# Patient Record
Sex: Female | Born: 1935 | Race: White | Hispanic: No | Smoking: Never smoker
Health system: Southern US, Community
[De-identification: ages and names within clinical notes are randomized; demographics above are authoritative.]

## PROBLEM LIST (undated history)

## (undated) DIAGNOSIS — H9190 Unspecified hearing loss, unspecified ear: Secondary | ICD-10-CM

## (undated) DIAGNOSIS — F0390 Unspecified dementia without behavioral disturbance: Secondary | ICD-10-CM

## (undated) DIAGNOSIS — M1990 Unspecified osteoarthritis, unspecified site: Secondary | ICD-10-CM

## (undated) DIAGNOSIS — I10 Essential (primary) hypertension: Secondary | ICD-10-CM

## (undated) DIAGNOSIS — F32A Depression, unspecified: Secondary | ICD-10-CM

## (undated) DIAGNOSIS — F329 Major depressive disorder, single episode, unspecified: Secondary | ICD-10-CM

## (undated) DIAGNOSIS — H3530 Unspecified macular degeneration: Secondary | ICD-10-CM

---

## 2014-09-21 ENCOUNTER — Ambulatory Visit (INDEPENDENT_AMBULATORY_CARE_PROVIDER_SITE_OTHER): Payer: Medicare Other | Admitting: Podiatry

## 2014-09-21 ENCOUNTER — Encounter: Payer: Self-pay | Admitting: Podiatry

## 2014-09-21 VITALS — BP 155/73 | HR 67

## 2014-09-21 DIAGNOSIS — L600 Ingrowing nail: Secondary | ICD-10-CM | POA: Diagnosis not present

## 2014-09-21 DIAGNOSIS — M79673 Pain in unspecified foot: Secondary | ICD-10-CM | POA: Diagnosis not present

## 2014-09-21 DIAGNOSIS — B351 Tinea unguium: Secondary | ICD-10-CM | POA: Diagnosis not present

## 2014-09-21 DIAGNOSIS — M79606 Pain in leg, unspecified: Secondary | ICD-10-CM | POA: Insufficient documentation

## 2014-09-21 DIAGNOSIS — M79605 Pain in left leg: Secondary | ICD-10-CM

## 2014-09-21 NOTE — Patient Instructions (Signed)
Seen for hypertrophic nails. All nails debrided. Return in 3 months or as needed.  

## 2014-09-21 NOTE — Progress Notes (Signed)
Subjective: 81 year old female presents accompanied by her daughter requesting toe nails trimmed. Patient is on wheel chair.  Review of Systems - Patient is hard of hearing and difficulty  With communication.   Objective: All pedal pulses are palpable. Both feet are cold. Thick nails with ingrown left great toe nail. Neurovascular status are within normal.  Mild digital contracture bilateral.   Assessment: Ingrown nail left great toe. Mycotic nails both great toes.  Plan:  Reviewed findings. All nails debrided.  Return as needed.

## 2014-12-25 ENCOUNTER — Encounter: Payer: Self-pay | Admitting: Podiatry

## 2014-12-25 ENCOUNTER — Ambulatory Visit (INDEPENDENT_AMBULATORY_CARE_PROVIDER_SITE_OTHER): Payer: Medicare Other | Admitting: Podiatry

## 2014-12-25 VITALS — BP 144/68 | HR 69

## 2014-12-25 DIAGNOSIS — M79606 Pain in leg, unspecified: Secondary | ICD-10-CM

## 2014-12-25 DIAGNOSIS — L03032 Cellulitis of left toe: Secondary | ICD-10-CM | POA: Diagnosis not present

## 2014-12-25 DIAGNOSIS — L600 Ingrowing nail: Secondary | ICD-10-CM | POA: Diagnosis not present

## 2014-12-25 DIAGNOSIS — B351 Tinea unguium: Secondary | ICD-10-CM

## 2014-12-25 NOTE — Patient Instructions (Signed)
Seen for hypertrophic nails. Noted of infected nail base left great toe.  All nails debrided. Pus drained and cleansed with Iodine.  Do Iodine cleansing on left great toe daily till healed. Return in 3 months or as needed.

## 2014-12-25 NOTE — Progress Notes (Signed)
Subjective: 81 year old female presents accompanied by her daughter requesting toe nails trimmed. Patient is on wheel chair.  Review of Systems - Patient is hard of hearing and difficulty With communication.   Objective: All pedal pulses are palpable. Both feet are cold. Thick nails with ingrown left great toe nail. Positive of pus pocket at the proximal medial border left great toe without associated drainage or cellulitis. Neurovascular status are within normal.  Mild digital contracture bilateral.   Assessment: Ingrown nail left great toe. Mycotic nails both great toes. Paronychia left great toe.  Plan:  Reviewed findings. All nails debrided.  I&D Paronychia left great toe.  Home care instruction given.  Return as needed.

## 2015-01-22 ENCOUNTER — Encounter: Payer: Self-pay | Admitting: Podiatry

## 2015-01-22 ENCOUNTER — Ambulatory Visit (INDEPENDENT_AMBULATORY_CARE_PROVIDER_SITE_OTHER): Payer: Medicare Other | Admitting: Podiatry

## 2015-01-22 VITALS — BP 146/58 | HR 71

## 2015-01-22 DIAGNOSIS — B351 Tinea unguium: Secondary | ICD-10-CM | POA: Diagnosis not present

## 2015-01-22 DIAGNOSIS — L600 Ingrowing nail: Secondary | ICD-10-CM | POA: Diagnosis not present

## 2015-01-22 NOTE — Progress Notes (Signed)
Subjective: 81 year old female presents via wheel chair accompanied by her daughter complaining of red toe with possible ingrown nail on left great toe.   Objective: All pedal pulses are palpable. Both feet are cold. Thick dystrophic yellow nail with inflamed ungual labia left great toe. No open skin or active drainage noted.  Neurovascular status are within normal.  Mild digital contracture bilateral.   Assessment: Deformed mycotic nail with inflamed ungual labia left great toe without cellulitis or active infection.  Plan:  Reviewed findings. Affected nail debrided. Lotrimin cream applied. Return as needed.

## 2015-01-22 NOTE — Patient Instructions (Signed)
Ingrown mycotic nail removed. No active infection noted other than fungal nail.  Return as needed or follow routine foot care schedule.

## 2015-03-27 ENCOUNTER — Encounter: Payer: Self-pay | Admitting: Podiatry

## 2015-03-27 ENCOUNTER — Ambulatory Visit (INDEPENDENT_AMBULATORY_CARE_PROVIDER_SITE_OTHER): Payer: Medicare Other | Admitting: Podiatry

## 2015-03-27 VITALS — BP 150/59 | HR 69

## 2015-03-27 DIAGNOSIS — B351 Tinea unguium: Secondary | ICD-10-CM

## 2015-03-27 DIAGNOSIS — L600 Ingrowing nail: Secondary | ICD-10-CM | POA: Diagnosis not present

## 2015-03-27 DIAGNOSIS — M79606 Pain in leg, unspecified: Secondary | ICD-10-CM

## 2015-03-27 NOTE — Patient Instructions (Signed)
Seen for hypertrophic nails. All nails debrided. Return in 3 months or as needed.  

## 2015-03-27 NOTE — Progress Notes (Signed)
Subjective: 81 year old female presents via wheel chair accompanied by her daughter complaining of painful nails.   Objective: All pedal pulses are not palpable today. Both feet are cold. Thick dystrophic yellow nail x 10.  Ingrown nail on both great toe medial borders without open skin or active drainage.  Neurovascular status are within normal.  Mild digital contracture bilateral.   Assessment: Deformed mycotic nails x 10. Ingrown nails both great toes without cellulitis or active infection.  Plan:  Reviewed findings. Affected nail debrided. Lotrimin cream applied to both great toes. Return as needed.

## 2015-06-25 ENCOUNTER — Ambulatory Visit: Payer: Medicare Other | Admitting: Podiatry

## 2015-07-02 ENCOUNTER — Encounter: Payer: Self-pay | Admitting: Podiatry

## 2015-07-02 ENCOUNTER — Ambulatory Visit (INDEPENDENT_AMBULATORY_CARE_PROVIDER_SITE_OTHER): Payer: Medicare Other | Admitting: Podiatry

## 2015-07-02 VITALS — BP 142/62 | HR 65

## 2015-07-02 DIAGNOSIS — B351 Tinea unguium: Secondary | ICD-10-CM

## 2015-07-02 DIAGNOSIS — M79605 Pain in left leg: Secondary | ICD-10-CM

## 2015-07-02 NOTE — Patient Instructions (Signed)
Seen for hypertrophic nails. All nails debrided. Return in 3 months or as needed.  

## 2015-07-02 NOTE — Progress Notes (Signed)
Subjective: 82 year old female presents via wheel chair accompanied by her daughter complaining of painful nails.   Objective: All pedal pulses are not palpable today. Both feet are cold. Thick dystrophic yellow nail x 10.  Ingrown nail on both great toe medial borders without open skin or active drainage.  Neurovascular status are within normal.  Mild digital contracture bilateral.   Assessment: Deformed mycotic nails x 10. Painful nails with ambulation.   Plan:  Reviewed findings. Affected nail debrided. Return as needed.

## 2015-09-22 ENCOUNTER — Emergency Department (HOSPITAL_BASED_OUTPATIENT_CLINIC_OR_DEPARTMENT_OTHER): Payer: Medicare Other

## 2015-09-22 ENCOUNTER — Emergency Department (HOSPITAL_BASED_OUTPATIENT_CLINIC_OR_DEPARTMENT_OTHER)
Admission: EM | Admit: 2015-09-22 | Discharge: 2015-09-22 | Disposition: A | Discharge status: Home / Self Care | Payer: Medicare Other | Attending: Emergency Medicine | Admitting: Emergency Medicine

## 2015-09-22 ENCOUNTER — Encounter (HOSPITAL_BASED_OUTPATIENT_CLINIC_OR_DEPARTMENT_OTHER): Payer: Self-pay | Admitting: *Deleted

## 2015-09-22 DIAGNOSIS — Y999 Unspecified external cause status: Secondary | ICD-10-CM | POA: Diagnosis not present

## 2015-09-22 DIAGNOSIS — Y939 Activity, unspecified: Secondary | ICD-10-CM | POA: Insufficient documentation

## 2015-09-22 DIAGNOSIS — I10 Essential (primary) hypertension: Secondary | ICD-10-CM | POA: Diagnosis not present

## 2015-09-22 DIAGNOSIS — M1990 Unspecified osteoarthritis, unspecified site: Secondary | ICD-10-CM | POA: Diagnosis not present

## 2015-09-22 DIAGNOSIS — F329 Major depressive disorder, single episode, unspecified: Secondary | ICD-10-CM | POA: Diagnosis not present

## 2015-09-22 DIAGNOSIS — Y929 Unspecified place or not applicable: Secondary | ICD-10-CM | POA: Insufficient documentation

## 2015-09-22 DIAGNOSIS — F0390 Unspecified dementia without behavioral disturbance: Secondary | ICD-10-CM | POA: Diagnosis not present

## 2015-09-22 DIAGNOSIS — S8992XA Unspecified injury of left lower leg, initial encounter: Secondary | ICD-10-CM | POA: Diagnosis present

## 2015-09-22 DIAGNOSIS — S8012XA Contusion of left lower leg, initial encounter: Secondary | ICD-10-CM | POA: Insufficient documentation

## 2015-09-22 DIAGNOSIS — W19XXXA Unspecified fall, initial encounter: Secondary | ICD-10-CM | POA: Diagnosis not present

## 2015-09-22 DIAGNOSIS — Z79899 Other long term (current) drug therapy: Secondary | ICD-10-CM | POA: Diagnosis not present

## 2015-09-22 HISTORY — DX: Depression, unspecified: F32.A

## 2015-09-22 HISTORY — DX: Unspecified macular degeneration: H35.30

## 2015-09-22 HISTORY — DX: Major depressive disorder, single episode, unspecified: F32.9

## 2015-09-22 HISTORY — DX: Unspecified osteoarthritis, unspecified site: M19.90

## 2015-09-22 HISTORY — DX: Essential (primary) hypertension: I10

## 2015-09-22 HISTORY — DX: Unspecified dementia, unspecified severity, without behavioral disturbance, psychotic disturbance, mood disturbance, and anxiety: F03.90

## 2015-09-22 LAB — URINALYSIS, ROUTINE W REFLEX MICROSCOPIC
Bilirubin Urine: NEGATIVE
GLUCOSE, UA: NEGATIVE mg/dL
Hgb urine dipstick: NEGATIVE
Ketones, ur: NEGATIVE mg/dL
Nitrite: NEGATIVE
PH: 6.5 (ref 5.0–8.0)
Protein, ur: NEGATIVE mg/dL
SPECIFIC GRAVITY, URINE: 1.013 (ref 1.005–1.030)

## 2015-09-22 LAB — URINE MICROSCOPIC-ADD ON

## 2015-09-22 NOTE — ED Notes (Signed)
Assisted pt with bedpan. incont care provided and brief placed. Assisted with getting patient dressed and in wheelchair and to the car. D/c instructions reviewed with pt's daughter who has poa and she  Voiced understanding.

## 2015-09-22 NOTE — Discharge Instructions (Signed)
Patient's x-rays are normal.  Her urine was sent for a culture to assess for infection.  If the culture comes back positive, you should get a call to start antibiotics.    Contusion A contusion is a deep bruise. Contusions are the result of a blunt injury to tissues and muscle fibers under the skin. The injury causes bleeding under the skin. The skin overlying the contusion may turn blue, purple, or yellow. Minor injuries will give you a painless contusion, but more severe contusions may stay painful and swollen for a few weeks.  CAUSES  This condition is usually caused by a blow, trauma, or direct force to an area of the body. SYMPTOMS  Symptoms of this condition include:  Swelling of the injured area.  Pain and tenderness in the injured area.  Discoloration. The area may have redness and then turn blue, purple, or yellow. DIAGNOSIS  This condition is diagnosed based on a physical exam and medical history. An X-ray, CT scan, or MRI may be needed to determine if there are any associated injuries, such as broken bones (fractures). TREATMENT  Specific treatment for this condition depends on what area of the body was injured. In general, the best treatment for a contusion is resting, icing, applying pressure to (compression), and elevating the injured area. This is often called the RICE strategy. Over-the-counter anti-inflammatory medicines may also be recommended for pain control.  HOME CARE INSTRUCTIONS   Rest the injured area.  If directed, apply ice to the injured area:  Put ice in a plastic bag.  Place a towel between your skin and the bag.  Leave the ice on for 20 minutes, 2-3 times per day.  If directed, apply light compression to the injured area using an elastic bandage. Make sure the bandage is not wrapped too tightly. Remove and reapply the bandage as directed by your health care provider.  If possible, raise (elevate) the injured area above the level of your heart while you  are sitting or lying down.  Take over-the-counter and prescription medicines only as told by your health care provider. SEEK MEDICAL CARE IF:  Your symptoms do not improve after several days of treatment.  Your symptoms get worse.  You have difficulty moving the injured area. SEEK IMMEDIATE MEDICAL CARE IF:   You have severe pain.  You have numbness in a hand or foot.  Your hand or foot turns pale or cold.   This information is not intended to replace advice given to you by your health care provider. Make sure you discuss any questions you have with your health care provider.   Document Released: 01/21/2005 Document Revised: 01/02/2015 Document Reviewed: 08/29/2014 Elsevier Interactive Patient Education [employer]2016 [employer] Inc.

## 2015-09-22 NOTE — ED Notes (Signed)
Per EMS:  [facility].  Reports unsure if it was witnessed or not.  States HA and L knee pain since that.  HTN-hx of same.  220/98

## 2015-09-22 NOTE — ED Provider Notes (Signed)
CSN: [id number]     Arrival date & time 09/22/15  2025 History  By signing my name below, I, Soijett Blue, attest that this documentation has been prepared under the direction and in the presence of Rolan Bucco, MD. Electronically Signed: Soijett Blue, ED Scribe. 09/22/2015. 9:21 PM.   Chief Complaint  Patient presents with  . Fall    LEVEL 5 CAVEAT: DEMENTIA  The history is provided by the EMS personnel and a relative. No language interpreter was used.    Madison Murray is a 82 y.o. female with a medical hx of dementia who presents to the Emergency Department via EMS complaining of a fall onset PTA. Hx provided by pt daughter who reports that the pt had an unwitnessed fall and was found on the floor via the staff. She was seated on the floor when they found her. Pt daughter was informed by the staff that the pt was trying to find a bathroom and that is what is believed to be the cause of her fall. Pt is typically wheelchair bound. Pt daughter states that the pt appears at her baseline. Pt daughter denies the pt having any recent illnesses.   Per EMS pt is having associated symptoms of HA and left knee pain. Pt daughter states that the left knee pain is chronic. She notes that she has not tried any medications for the relief of her symptoms. She denies any other symptoms.   Past Medical History  Diagnosis Date  . Hypertension   . Dementia   . Osteoarthritis   . Depression   . Macular degeneration    History reviewed. No pertinent past surgical history. History reviewed. No pertinent family history. Social History  Substance Use Topics  . Smoking status: Never Smoker   . Smokeless tobacco: Never Used  . Alcohol Use: No   OB History    No data available     Review of Systems  Unable to perform ROS: Dementia    Allergies  Demerol; Naproxen; Percocet; and Phenergan  Home Medications   Prior to Admission medications   Medication Sig Start Date End Date Taking?  Authorizing Provider  aspirin 81 MG tablet Take 81 mg by mouth daily.    Historical Provider, MD  metoprolol tartrate (LOPRESSOR) 25 MG tablet  09/08/14   Historical Provider, MD  ondansetron (ZOFRAN-ODT) 4 MG disintegrating tablet  06/29/14   Historical Provider, MD   BP 199/89 mmHg  Pulse 71  Resp 20  SpO2 98% Physical Exam  Constitutional: She is oriented to person, place, and time. She appears well-developed and well-nourished.  HENT:  Head: Normocephalic and atraumatic.  Eyes: Pupils are equal, round, and reactive to light.  Neck: Normal range of motion. Neck supple.  Cardiovascular: Normal rate and regular rhythm.  Exam reveals no gallop and no friction rub.   Murmur heard. Pulmonary/Chest: Effort normal and breath sounds normal. No respiratory distress. She has no wheezes. She has no rales. She exhibits no tenderness.  Abdominal: Soft. Bowel sounds are normal. There is no tenderness. There is no rebound and no guarding.  Musculoskeletal: Normal range of motion. She exhibits no edema.  No pain along hte spine. Pain on ROM of the left hip and left knee. No swelling or deformity. No pain to the ankle. No other pain on palpation of other extremities. Moves all extremities symmetrical.   Lymphadenopathy:    She has no cervical adenopathy.  Neurological: She is alert and oriented to person, place, and time.  Sleepy but wakes up to answer questions.   Skin: Skin is warm and dry. No rash noted.  Psychiatric: She has a normal mood and affect.  Nursing note and vitals reviewed.   ED Course  Procedures (including critical care time) DIAGNOSTIC STUDIES: Oxygen Saturation is 98% on RA, nl by my interpretation.    COORDINATION OF CARE: 9:14 PM Discussed treatment plan with pt at bedside which includes UA, CT head, CT c-spine, left hip unilateral with pelvis xray, left knee xray and pt agreed to plan.    Labs Review Labs Reviewed  URINALYSIS, ROUTINE W REFLEX MICROSCOPIC (NOT AT [facility])  - Abnormal; Notable for the following:    Leukocytes, UA MODERATE (*)    All other components within normal limits  URINE MICROSCOPIC-ADD ON - Abnormal; Notable for the following:    Squamous Epithelial / LPF 0-5 (*)    Bacteria, UA FEW (*)    All other components within normal limits  URINE CULTURE    Imaging Review Ct Head Wo Contrast  09/22/2015  CLINICAL DATA:  Unwitnessed fall prior to arrival.  Headache. EXAM: CT HEAD WITHOUT CONTRAST CT CERVICAL SPINE WITHOUT CONTRAST TECHNIQUE: Multidetector CT imaging of the head and cervical spine was performed following the standard protocol without intravenous contrast. Multiplanar CT image reconstructions of the cervical spine were also generated. COMPARISON:  None. FINDINGS: CT HEAD FINDINGS No intracranial hemorrhage, mass effect, or midline shift. Generalized cerebral atrophy. Advanced chronic small vessel ischemia. No hydrocephalus. The basilar cisterns are patent. No evidence of territorial infarct. No intracranial fluid collection. Atherosclerosis of skullbase vasculature. Calvarium is intact. Mucosal thickening of the left maxillary sinus. The mastoid air cells are well aerated. CT CERVICAL SPINE FINDINGS No fracture or acute subluxation. The dens is intact. There are no jumped or perched facets. Multilevel degenerative change throughout cervical spine. Disc space narrowing throughout with endplate spurs, most prominent at C5-C6 and C6-C7. Anterolisthesis of sleep 4 on C5 appears degenerative. Multilevel facet arthropathy. No prevertebral soft tissue edema. There is biapical pleural parenchymal scarring. IMPRESSION: 1. No acute intracranial abnormality. Atrophy and advanced chronic small vessel ischemia. 2. Advanced multilevel degenerative change in the cervical spine without acute fracture or subluxation. Electronically Signed   By: Rubye Oaks M.D.   On: 09/22/2015 22:17   Ct Cervical Spine Wo Contrast  09/22/2015  CLINICAL DATA:   Unwitnessed fall prior to arrival.  Headache. EXAM: CT HEAD WITHOUT CONTRAST CT CERVICAL SPINE WITHOUT CONTRAST TECHNIQUE: Multidetector CT imaging of the head and cervical spine was performed following the standard protocol without intravenous contrast. Multiplanar CT image reconstructions of the cervical spine were also generated. COMPARISON:  None. FINDINGS: CT HEAD FINDINGS No intracranial hemorrhage, mass effect, or midline shift. Generalized cerebral atrophy. Advanced chronic small vessel ischemia. No hydrocephalus. The basilar cisterns are patent. No evidence of territorial infarct. No intracranial fluid collection. Atherosclerosis of skullbase vasculature. Calvarium is intact. Mucosal thickening of the left maxillary sinus. The mastoid air cells are well aerated. CT CERVICAL SPINE FINDINGS No fracture or acute subluxation. The dens is intact. There are no jumped or perched facets. Multilevel degenerative change throughout cervical spine. Disc space narrowing throughout with endplate spurs, most prominent at C5-C6 and C6-C7. Anterolisthesis of sleep 4 on C5 appears degenerative. Multilevel facet arthropathy. No prevertebral soft tissue edema. There is biapical pleural parenchymal scarring. IMPRESSION: 1. No acute intracranial abnormality. Atrophy and advanced chronic small vessel ischemia. 2. Advanced multilevel degenerative change in the cervical spine without acute fracture or  subluxation. Electronically Signed   By: Rubye OaksMelanie  Ehinger M.D.   On: 09/22/2015 22:17   Dg Knee Complete 4 Views Left  09/22/2015  CLINICAL DATA:  Left knee pain after fall today. EXAM: LEFT KNEE - COMPLETE 4+ VIEW COMPARISON:  None. FINDINGS: No acute fracture or dislocation. Moderate osteoarthritis with tricompartmental osteophytes and narrowing of the medial compartment. The bones are under mineralized. There is a prominent quadriceps tendon enthesophyte. Small joint effusion. IMPRESSION: Tricompartmental osteoarthritis without  evidence of acute fracture or subluxation. Electronically Signed   By: Rubye OaksMelanie  Ehinger M.D.   On: 09/22/2015 21:54   Dg Hip Unilat With Pelvis 2-3 Views Left  09/22/2015  CLINICAL DATA:  Left hip pain after fall tonight. EXAM: DG HIP (WITH OR WITHOUT PELVIS) 2-3V LEFT COMPARISON:  None. FINDINGS: Lateral plate and compression screw fixation fixate proximal femur fracture. Hardware is intact. No periprosthetic fracture. No acute fractures seen. Bony pelvis including the pubic rami appear intact. The bones are under mineralized. IMPRESSION: Intact surgical hardware about the left hip, no complication. No acute or periprosthetic fracture. Electronically Signed   By: Rubye OaksMelanie  Ehinger M.D.   On: 09/22/2015 21:56   I have personally reviewed and evaluated these images and lab results as part of my medical decision-making.   EKG Interpretation None      MDM   Final diagnoses:  Fall, initial encounter  Contusion of leg, left, initial encounter    Patient status post unwitnessed fall. She has a dementia but per family is at baseline. There is no reported recent illnesses or change in behavior. Patient currently is alert and talking. She is at her baseline mental status per her daughter. There is no evidence of fractures. She is able to raise her left leg off the bed without discomfort. I did not ambulate her as she is wheelchair-bound per the daughter. She was discharged back to the nursing home in good condition. Her urine had positive leukocyte esterase but no other suggestions of infection. Her urine was sent for culture but I did not start her on antibiotics at this point.  I personally performed the services described in this documentation, which was scribed in my presence.  The recorded information has been reviewed and considered.    Rolan BuccoMelanie Tan Clopper, MD 09/22/15 [phone]

## 2015-09-24 LAB — URINE CULTURE: SPECIAL REQUESTS: NORMAL

## 2015-10-02 ENCOUNTER — Ambulatory Visit (INDEPENDENT_AMBULATORY_CARE_PROVIDER_SITE_OTHER): Payer: Medicare Other | Admitting: Podiatry

## 2015-10-02 ENCOUNTER — Encounter: Payer: Self-pay | Admitting: Podiatry

## 2015-10-02 DIAGNOSIS — M79673 Pain in unspecified foot: Secondary | ICD-10-CM

## 2015-10-02 DIAGNOSIS — B351 Tinea unguium: Secondary | ICD-10-CM | POA: Diagnosis not present

## 2015-10-02 DIAGNOSIS — L600 Ingrowing nail: Secondary | ICD-10-CM | POA: Diagnosis not present

## 2015-10-02 DIAGNOSIS — M79606 Pain in leg, unspecified: Secondary | ICD-10-CM

## 2015-10-02 NOTE — Patient Instructions (Signed)
Seen for hypertrophic nails. All nails debrided. Return in 3 months or as needed.  

## 2015-10-02 NOTE — Progress Notes (Signed)
Subjective: 82 year old female presents via wheel chair accompanied by her daughter complaining of painful nails.   Objective: All pedal pulses are not palpable today. Both feet are cold. Thick dystrophic yellow nail x 10.  Ingrown nail on both great toe medial borders without open skin or active drainage.  Neurovascular status are within normal.  Mild digital contracture bilateral.   Assessment: Deformed mycotic nails x 10. Ingrown nails on both great toe both borders with pain.  Painful nails with ambulation.   Plan:  Reviewed findings. Affected nails debrided. Return as needed

## 2016-01-02 ENCOUNTER — Ambulatory Visit: Payer: Medicare Other | Admitting: Podiatry

## 2016-01-09 ENCOUNTER — Ambulatory Visit: Payer: Medicare Other | Admitting: Podiatry

## 2016-01-14 ENCOUNTER — Ambulatory Visit (INDEPENDENT_AMBULATORY_CARE_PROVIDER_SITE_OTHER): Payer: Medicare Other | Admitting: Podiatry

## 2016-01-14 ENCOUNTER — Encounter: Payer: Self-pay | Admitting: Podiatry

## 2016-01-14 DIAGNOSIS — B351 Tinea unguium: Secondary | ICD-10-CM | POA: Diagnosis not present

## 2016-01-14 DIAGNOSIS — L600 Ingrowing nail: Secondary | ICD-10-CM

## 2016-01-14 DIAGNOSIS — M79606 Pain in leg, unspecified: Secondary | ICD-10-CM

## 2016-01-14 NOTE — Patient Instructions (Signed)
Seen for hypertrophic nails. Noted of ingrown nail on both great toe without infection or inflammation.  All nails debrided. Return in 3 months or sooner if needed.

## 2016-01-14 NOTE — Progress Notes (Signed)
Subjective: 82 year old female presents via wheel chair accompanied by her daughter complaining of painful nails.   Objective: All pedal pulses are not palpable today. Both feet are cold. Thick dystrophic yellow nail x 10.  Ingrown nail on both great toe medial borders without open skin or active drainage.  Neurovascular status are within normal.  Positive for mild lower limb edema above ankle joint. Mild digital contracture bilateral.   Assessment: Deformed mycotic nails x 10. Ingrown nails on both great toe both borders with pain. No open skin or inflammation noted. Painful nails.  Plan:  Reviewed findings. Affected nails debrided. Advised to return as soon as the toes become symptomatic.

## 2017-02-13 ENCOUNTER — Encounter (HOSPITAL_BASED_OUTPATIENT_CLINIC_OR_DEPARTMENT_OTHER): Payer: Self-pay | Admitting: Emergency Medicine

## 2017-02-13 ENCOUNTER — Emergency Department (HOSPITAL_BASED_OUTPATIENT_CLINIC_OR_DEPARTMENT_OTHER): Payer: Medicare Other

## 2017-02-13 ENCOUNTER — Emergency Department (HOSPITAL_BASED_OUTPATIENT_CLINIC_OR_DEPARTMENT_OTHER)
Admission: EM | Admit: 2017-02-13 | Discharge: 2017-02-13 | Disposition: A | Discharge status: Home / Self Care | Payer: Medicare Other | Attending: Emergency Medicine | Admitting: Emergency Medicine

## 2017-02-13 DIAGNOSIS — I10 Essential (primary) hypertension: Secondary | ICD-10-CM | POA: Diagnosis not present

## 2017-02-13 DIAGNOSIS — F0391 Unspecified dementia with behavioral disturbance: Secondary | ICD-10-CM | POA: Insufficient documentation

## 2017-02-13 DIAGNOSIS — Z7982 Long term (current) use of aspirin: Secondary | ICD-10-CM | POA: Diagnosis not present

## 2017-02-13 DIAGNOSIS — Z79899 Other long term (current) drug therapy: Secondary | ICD-10-CM | POA: Diagnosis not present

## 2017-02-13 DIAGNOSIS — R4182 Altered mental status, unspecified: Secondary | ICD-10-CM | POA: Diagnosis present

## 2017-02-13 LAB — URINALYSIS, COMPLETE (UACMP) WITH MICROSCOPIC
Bilirubin Urine: NEGATIVE
Glucose, UA: NEGATIVE mg/dL
Hgb urine dipstick: NEGATIVE
KETONES UR: NEGATIVE mg/dL
Leukocytes, UA: NEGATIVE
Nitrite: NEGATIVE
PH: 7 (ref 5.0–8.0)
PROTEIN: 30 mg/dL — AB
Specific Gravity, Urine: 1.015 (ref 1.005–1.030)

## 2017-02-13 LAB — COMPREHENSIVE METABOLIC PANEL
ALBUMIN: 4.1 g/dL (ref 3.5–5.0)
ALK PHOS: 62 U/L (ref 38–126)
ALT: 13 U/L — ABNORMAL LOW (ref 14–54)
AST: 18 U/L (ref 15–41)
Anion gap: 7 (ref 5–15)
BILIRUBIN TOTAL: 0.8 mg/dL (ref 0.3–1.2)
BUN: 15 mg/dL (ref 6–20)
CHLORIDE: 104 mmol/L (ref 101–111)
CO2: 28 mmol/L (ref 22–32)
CREATININE: 0.66 mg/dL (ref 0.44–1.00)
Calcium: 9 mg/dL (ref 8.9–10.3)
GFR calc Af Amer: >60 mL/min (ref >60)
GFR calc non Af Amer: >60 mL/min (ref >60)
GLUCOSE: 105 mg/dL — AB (ref 65–99)
Potassium: 3.9 mmol/L (ref 3.5–5.1)
SODIUM: 139 mmol/L (ref 135–145)
Total Protein: 6.9 g/dL (ref 6.5–8.1)

## 2017-02-13 LAB — CBC WITH DIFFERENTIAL/PLATELET
Basophils Absolute: 0.2 10*3/uL — ABNORMAL HIGH (ref 0.0–0.1)
Basophils Relative: 2 %
EOS PCT: 2 %
Eosinophils Absolute: 0.1 10*3/uL (ref 0.0–0.7)
HCT: 41.2 % (ref 36.0–46.0)
Hemoglobin: 13.6 g/dL (ref 12.0–15.0)
LYMPHS ABS: 1.6 10*3/uL (ref 0.7–4.0)
Lymphocytes Relative: 21 %
MCH: 31.9 pg (ref 26.0–34.0)
MCHC: 33 g/dL (ref 30.0–36.0)
MCV: 96.7 fL (ref 78.0–100.0)
MONO ABS: 0.6 10*3/uL (ref 0.1–1.0)
MONOS PCT: 8 %
NEUTROS PCT: 67 %
Neutro Abs: 5 10*3/uL (ref 1.7–7.7)
Platelets: 187 10*3/uL (ref 150–400)
RBC: 4.26 MIL/uL (ref 3.87–5.11)
RDW: 13.9 % (ref 11.5–15.5)
WBC: 7.5 10*3/uL (ref 4.0–10.5)

## 2017-02-13 LAB — CBG MONITORING, ED: Glucose-Capillary: 86 mg/dL (ref 65–99)

## 2017-02-13 NOTE — ED Triage Notes (Signed)
Patient is from nursing home facility.  Son requested patient to transfer for evaluation? Possible UTI.    Patient has become more combative over the last week.  Patient is at baseline.  Patient is aggressive, hitting and kicking EMS personnel.  EMS administered 5 mg of Versed, IM.    EMS vitals:  BP: 146/68 HR: 85 R:16 97% on room air    CBG: 137

## 2017-02-13 NOTE — ED Notes (Signed)
[facility] called for transport @ 1427

## 2017-02-13 NOTE — ED Provider Notes (Signed)
[facility] EMERGENCY DEPARTMENT Provider Note   CSN: [id number] Arrival date & time: 02/13/17  1245     History   Chief Complaint Chief Complaint  Patient presents with  . Altered Mental Status    HPI Madison Murray is a 83 y.o. female.  Pt presents to the ED today via EMS due to increased agitation.   Pt is from a nursing home and they said she has been more agitated.  The pt was very aggressive with EMS and required 5 mg of versed IM for transport.  Pt has dementia and is unable to contribute to history.      Past Medical History:  Diagnosis Date  . Dementia   . Depression   . Hypertension   . Macular degeneration   . Osteoarthritis     Patient Active Problem List   Diagnosis Date Noted  . Paronychia of great toe of left foot 12/25/2014  . Ingrown nail 09/21/2014  . Onychomycosis 09/21/2014  . Pain in lower limb 09/21/2014    History reviewed. No pertinent surgical history.  OB History    No data available       Home Medications    Prior to Admission medications   Medication Sig Start Date End Date Taking? Authorizing Provider  aspirin 81 MG tablet Take 81 mg by mouth daily.    [provider]  metoprolol tartrate (LOPRESSOR) 25 MG tablet  09/08/14   [provider]  ondansetron (ZOFRAN-ODT) 4 MG disintegrating tablet  06/29/14   [provider]    Family History History reviewed. No pertinent family history.  Social History Social History  Substance Use Topics  . Smoking status: Never Smoker  . Smokeless tobacco: Never Used  . Alcohol use No     Allergies   Demerol [meperidine]; Naproxen; Percocet [oxycodone-acetaminophen]; and Phenergan [promethazine hcl]   Review of Systems Review of Systems  Unable to perform ROS: Dementia     Physical Exam Updated Vital Signs BP 127/90 (BP Location: Left Arm)   Pulse 80   Temp 98.2 F (36.8 C) (Axillary)   Resp 20   SpO2 94%   Physical Exam    Constitutional: She appears well-developed and well-nourished.  HENT:  Head: Normocephalic and atraumatic.  Right Ear: External ear normal.  Left Ear: External ear normal.  Nose: Nose normal.  Mouth/Throat: Oropharynx is clear and moist.  Eyes: Pupils are equal, round, and reactive to light. Conjunctivae and EOM are normal.  Neck: Normal range of motion. Neck supple.  Cardiovascular: Normal rate, regular rhythm, normal heart sounds and intact distal pulses.   Pulmonary/Chest: Effort normal and breath sounds normal.  Abdominal: Soft. Bowel sounds are normal.  Musculoskeletal: Normal range of motion.  Neurological: She is alert.  Pt not cooperative with exam, but she is moving all 4 extremities.  She is not following commands.  Skin: Skin is warm.  Small skin tear right hand  Psychiatric: Judgment and thought content normal. Her affect is angry. She is agitated.  Nursing note and vitals reviewed.    ED Treatments / Results  Labs (all labs ordered are listed, but only abnormal results are displayed) Labs Reviewed  COMPREHENSIVE METABOLIC PANEL - Abnormal; Notable for the following:       Result Value   Glucose, Bld 105 (*)    ALT 13 (*)    All other components within normal limits  URINALYSIS, COMPLETE (UACMP) WITH MICROSCOPIC - Abnormal; Notable for the following:    Protein,  ur 30 (*)    Squamous Epithelial / LPF 0-5 (*)    Bacteria, UA RARE (*)    All other components within normal limits  CBC WITH DIFFERENTIAL/PLATELET  CBG MONITORING, ED    EKG  EKG Interpretation None       Radiology Dg Chest Port 1 View  Result Date: 02/13/2017 CLINICAL DATA:  83 year old female with possible urinary tract infection EXAM: PORTABLE CHEST 1 VIEW COMPARISON:  Prior chest x-ray 11/08/2016 FINDINGS: Stable cardiac mediastinal contours. Atherosclerotic calcifications are present in the aorta. Stable chronic bronchitic changes and interstitial prominence. No significant interval  change in the appearance of the lungs compared to prior imaging. Lingular atelectasis versus scarring is similar. No acute osseous abnormality. IMPRESSION: Stable chest x-ray without evidence of acute cardiopulmonary process. Electronically Signed   By: Malachy MoanHeath  McCullough M.D.   On: 02/13/2017 13:38    Procedures Procedures (including critical care time)  Medications Ordered in ED Medications - No data to display   Initial Impression / Assessment and Plan / ED Course  I have reviewed the triage vital signs and the nursing notes.  Pertinent labs & imaging results that were available during my care of the patient were reviewed by me and considered in my medical decision making (see chart for details).    Family here now (they were told pt was at [facility]).  They are upset pt was brought to the ED.  They said they were not called prior to transport.  They said pt was upset b/c she did not understand why she was being taken away.  Pt is stable for d/c back to SNF.  Final Clinical Impressions(s) / ED Diagnoses   Final diagnoses:  Dementia with behavioral disturbance, unspecified dementia type    New Prescriptions New Prescriptions   No medications on file     Jacalyn LefevreHaviland, Ashvin Adelson, MD 02/13/17 1421

## 2017-02-13 NOTE — ED Notes (Signed)
[facility]  Son, Richard at bedside.

## 2017-02-13 NOTE — ED Notes (Signed)
I and o catheter completed per MD Havilands order

## 2017-11-12 ENCOUNTER — Emergency Department (HOSPITAL_BASED_OUTPATIENT_CLINIC_OR_DEPARTMENT_OTHER)
Admission: EM | Admit: 2017-11-12 | Discharge: 2017-11-12 | Disposition: A | Discharge status: Home / Self Care | Payer: Medicare Other | Attending: Emergency Medicine | Admitting: Emergency Medicine

## 2017-11-12 ENCOUNTER — Other Ambulatory Visit: Payer: Self-pay

## 2017-11-12 ENCOUNTER — Encounter (HOSPITAL_BASED_OUTPATIENT_CLINIC_OR_DEPARTMENT_OTHER): Payer: Self-pay | Admitting: *Deleted

## 2017-11-12 DIAGNOSIS — F0390 Unspecified dementia without behavioral disturbance: Secondary | ICD-10-CM | POA: Diagnosis not present

## 2017-11-12 DIAGNOSIS — I10 Essential (primary) hypertension: Secondary | ICD-10-CM | POA: Insufficient documentation

## 2017-11-12 DIAGNOSIS — Z79899 Other long term (current) drug therapy: Secondary | ICD-10-CM | POA: Diagnosis not present

## 2017-11-12 DIAGNOSIS — R112 Nausea with vomiting, unspecified: Secondary | ICD-10-CM

## 2017-11-12 HISTORY — DX: Unspecified hearing loss, unspecified ear: H91.90

## 2017-11-12 LAB — CBC WITH DIFFERENTIAL/PLATELET
BASOS ABS: 0.1 10*3/uL (ref 0.0–0.1)
BASOS PCT: 1 %
EOS ABS: 0.2 10*3/uL (ref 0.0–0.7)
Eosinophils Relative: 3 %
HCT: 42.3 % (ref 36.0–46.0)
Hemoglobin: 14.4 g/dL (ref 12.0–15.0)
Lymphocytes Relative: 23 %
Lymphs Abs: 1.7 10*3/uL (ref 0.7–4.0)
MCH: 32 pg (ref 26.0–34.0)
MCHC: 34 g/dL (ref 30.0–36.0)
MCV: 94 fL (ref 78.0–100.0)
MONOS PCT: 11 %
Monocytes Absolute: 0.8 10*3/uL (ref 0.1–1.0)
NEUTROS PCT: 62 %
Neutro Abs: 4.6 10*3/uL (ref 1.7–7.7)
PLATELETS: 194 10*3/uL (ref 150–400)
RBC: 4.5 MIL/uL (ref 3.87–5.11)
RDW: 14.5 % (ref 11.5–15.5)
WBC: 7.4 10*3/uL (ref 4.0–10.5)

## 2017-11-12 LAB — COMPREHENSIVE METABOLIC PANEL
ALT: 14 U/L (ref 0–44)
AST: 21 U/L (ref 15–41)
Albumin: 3.6 g/dL (ref 3.5–5.0)
Alkaline Phosphatase: 55 U/L (ref 38–126)
Anion gap: 8 (ref 5–15)
BUN: 14 mg/dL (ref 8–23)
CHLORIDE: 105 mmol/L (ref 98–111)
CO2: 27 mmol/L (ref 22–32)
CREATININE: 0.49 mg/dL (ref 0.44–1.00)
Calcium: 8.9 mg/dL (ref 8.9–10.3)
GFR calc Af Amer: >60 mL/min (ref >60)
Glucose, Bld: 109 mg/dL — ABNORMAL HIGH (ref 70–99)
Potassium: 3.8 mmol/L (ref 3.5–5.1)
Sodium: 140 mmol/L (ref 135–145)
Total Bilirubin: 1 mg/dL (ref 0.3–1.2)
Total Protein: 6.7 g/dL (ref 6.5–8.1)

## 2017-11-12 LAB — URINALYSIS, ROUTINE W REFLEX MICROSCOPIC
BILIRUBIN URINE: NEGATIVE
GLUCOSE, UA: NEGATIVE mg/dL
HGB URINE DIPSTICK: NEGATIVE
Ketones, ur: NEGATIVE mg/dL
Leukocytes, UA: NEGATIVE
Nitrite: NEGATIVE
PH: 7 (ref 5.0–8.0)
Protein, ur: NEGATIVE mg/dL
SPECIFIC GRAVITY, URINE: 1.01 (ref 1.005–1.030)

## 2017-11-12 LAB — LIPASE, BLOOD: LIPASE: 24 U/L (ref 11–51)

## 2017-11-12 MED ORDER — ONDANSETRON HCL 4 MG/2ML IJ SOLN
4.0000 mg | Freq: Once | INTRAMUSCULAR | Status: AC
  Administered 2017-11-12: 4 mg via INTRAVENOUS
  Filled 2017-11-12: qty 2

## 2017-11-12 MED ORDER — SODIUM CHLORIDE 0.9 % IV BOLUS
1000.0000 mL | Freq: Once | INTRAVENOUS | Status: AC
  Administered 2017-11-12: 1000 mL via INTRAVENOUS

## 2017-11-12 NOTE — ED Notes (Signed)
ED Provider at bedside. 

## 2017-11-12 NOTE — ED Notes (Signed)
Gave patient water to drinking; tolerating well at this time; will continue to monitor; daughter at bedside.

## 2017-11-12 NOTE — ED Provider Notes (Signed)
[facility] Provider Note: Lowella DellJ. Lane Nylene Inlow, MD, FACEP  CSN: [id number] MRN: [id number] ARRIVAL: 11/12/17 at 0056 ROOM: [facility]06   CHIEF COMPLAINT  Vomiting  Level 5 caveat: Dementia HISTORY OF PRESENT ILLNESS  11/12/17 1:10 AM Madison Murray is a 84 y.o. female with a history of dementia who is in a memory care unit.  Staff reports she was complaining of feeling short of breath after which she vomited 2 times.  The emesis was described as bilious.  She did not complain of abdominal pain and there is no report of diarrhea.  She has had no further vomiting and her daughter states she is not currently in pain or short of breath.  She is not ambulatory at her living facility and gets around in a wheelchair.  Past Medical History:  Diagnosis Date  . Dementia   . Depression   . HOH (hard of hearing)   . Hypertension   . Macular degeneration   . Osteoarthritis     History reviewed. No pertinent surgical history.  History reviewed. No pertinent family history.  Social History   Tobacco Use  . Smoking status: Never Smoker  . Smokeless tobacco: Never Used  Substance Use Topics  . Alcohol use: No    Alcohol/week: 0.0 oz  . Drug use: No    Prior to Admission medications   Medication Sig Start Date End Date Taking? Authorizing Provider  metoprolol tartrate (LOPRESSOR) 25 MG tablet  09/08/14  Yes [provider]  ondansetron (ZOFRAN-ODT) 4 MG disintegrating tablet  06/29/14  Yes [provider]  sertraline (ZOLOFT) 25 MG tablet Take 25 mg by mouth daily.   Yes [provider]    Allergies Demerol [meperidine]; Iodine; Ivp dye [iodinated diagnostic agents]; Naproxen; Other; Percocet [oxycodone-acetaminophen]; and Phenergan [promethazine hcl]   REVIEW OF SYSTEMS     PHYSICAL EXAMINATION  Initial Vital Signs Blood pressure (!) 189/67, pulse 64, temperature 98.1 F (36.7 C), temperature source Oral, SpO2 97 %.  Examination General:  Well-developed, well-nourished female in no acute distress; appearance consistent with age of record HENT: normocephalic; atraumatic Eyes: Bilateral pseudophakia; right pupil irregular; left pupil round and reactive to light; extraocular muscle grossly intact Neck: supple Heart: regular rate and rhythm Lungs: clear to auscultation bilaterally Abdomen: soft; nondistended; nontender; bowel sounds present Extremities: No deformity; full range of motion; pulses normal; trace edema of lower legs Neurologic: Awake, alert; minimally verbal; noted to move all extremities; no facial droop Skin: Warm and dry   RESULTS  Summary of this visit's results, reviewed by myself:   EKG Interpretation  Date/Time:    Ventricular Rate:    PR Interval:    QRS Duration:   QT Interval:    QTC Calculation:   R Axis:     Text Interpretation:        Laboratory Studies: Results for orders placed or performed during the hospital encounter of 11/12/17 (from the past 24 hour(s))  CBC with Differential     Status: None   Collection Time: 11/12/17  1:06 AM  Result Value Ref Range   WBC 7.4 4.0 - 10.5 K/uL   RBC 4.50 3.87 - 5.11 MIL/uL   Hemoglobin 14.4 12.0 - 15.0 g/dL   HCT 66.442.3 40.336.0 - 47.446.0 %   MCV 94.0 78.0 - 100.0 fL   MCH 32.0 26.0 - 34.0 pg   MCHC 34.0 30.0 - 36.0 g/dL   RDW 25.914.5 56.311.5 - 87.515.5 %   Platelets 194 150 - 400  K/uL   Neutrophils Relative % 62 %   Neutro Abs 4.6 1.7 - 7.7 K/uL   Lymphocytes Relative 23 %   Lymphs Abs 1.7 0.7 - 4.0 K/uL   Monocytes Relative 11 %   Monocytes Absolute 0.8 0.1 - 1.0 K/uL   Eosinophils Relative 3 %   Eosinophils Absolute 0.2 0.0 - 0.7 K/uL   Basophils Relative 1 %   Basophils Absolute 0.1 0.0 - 0.1 K/uL  Comprehensive metabolic panel     Status: Abnormal   Collection Time: 11/12/17  1:06 AM  Result Value Ref Range   Sodium 140 135 - 145 mmol/L   Potassium 3.8 3.5 - 5.1 mmol/L   Chloride 105 98 - 111 mmol/L   CO2 27 22 - 32 mmol/L   Glucose, Bld 109  (H) 70 - 99 mg/dL   BUN 14 8 - 23 mg/dL   Creatinine, Ser 1.61 0.44 - 1.00 mg/dL   Calcium 8.9 8.9 - 09.6 mg/dL   Total Protein 6.7 6.5 - 8.1 g/dL   Albumin 3.6 3.5 - 5.0 g/dL   AST 21 15 - 41 U/L   ALT 14 0 - 44 U/L   Alkaline Phosphatase 55 38 - 126 U/L   Total Bilirubin 1.0 0.3 - 1.2 mg/dL   GFR calc non Af Amer >60 >60 mL/min   GFR calc Af Amer >60 >60 mL/min   Anion gap 8 5 - 15  Lipase, blood     Status: None   Collection Time: 11/12/17  1:06 AM  Result Value Ref Range   Lipase 24 11 - 51 U/L  Urinalysis, Routine w reflex microscopic     Status: None   Collection Time: 11/12/17  1:20 AM  Result Value Ref Range   Color, Urine YELLOW YELLOW   APPearance CLEAR CLEAR   Specific Gravity, Urine 1.010 1.005 - 1.030   pH 7.0 5.0 - 8.0   Glucose, UA NEGATIVE NEGATIVE mg/dL   Hgb urine dipstick NEGATIVE NEGATIVE   Bilirubin Urine NEGATIVE NEGATIVE   Ketones, ur NEGATIVE NEGATIVE mg/dL   Protein, ur NEGATIVE NEGATIVE mg/dL   Nitrite NEGATIVE NEGATIVE   Leukocytes, UA NEGATIVE NEGATIVE   Imaging Studies: No results found.  ED COURSE and MDM  Nursing notes and initial vitals signs, including pulse oximetry, reviewed.  Vitals:   11/12/17 0103 11/12/17 0245  BP: (!) 189/67 (!) 160/57  Pulse: 64 60  Resp:  16  Temp: 98.1 F (36.7 C)   TempSrc: Oral   SpO2: 97% 96%   2:45 AM Patient drinking fluids without emesis after IV fluids and Zofran.  Laboratory studies and physical examination reassuring.  Patient's daughter is comfortable taking her back to her memory care facility.  She already has Zofran prescribed for her.  PROCEDURES    ED DIAGNOSES     ICD-10-CM   1. Nausea and vomiting in adult R11.2        Rondell Frick, Jonny Ruiz, MD 11/12/17 0246

## 2017-11-12 NOTE — ED Notes (Signed)
Family at bedside. 

## 2017-11-12 NOTE — ED Triage Notes (Signed)
Brought in by [facility] from NH for vomiting

## 2018-02-09 ENCOUNTER — Encounter (HOSPITAL_BASED_OUTPATIENT_CLINIC_OR_DEPARTMENT_OTHER): Payer: Self-pay | Admitting: Emergency Medicine

## 2018-02-09 ENCOUNTER — Other Ambulatory Visit: Payer: Self-pay

## 2018-02-09 ENCOUNTER — Emergency Department (HOSPITAL_BASED_OUTPATIENT_CLINIC_OR_DEPARTMENT_OTHER): Payer: Medicare Other

## 2018-02-09 ENCOUNTER — Emergency Department (HOSPITAL_BASED_OUTPATIENT_CLINIC_OR_DEPARTMENT_OTHER)
Admission: EM | Admit: 2018-02-09 | Discharge: 2018-02-09 | Disposition: A | Discharge status: Home / Self Care | Payer: Medicare Other | Attending: Emergency Medicine | Admitting: Emergency Medicine

## 2018-02-09 DIAGNOSIS — F329 Major depressive disorder, single episode, unspecified: Secondary | ICD-10-CM | POA: Insufficient documentation

## 2018-02-09 DIAGNOSIS — I10 Essential (primary) hypertension: Secondary | ICD-10-CM | POA: Diagnosis not present

## 2018-02-09 DIAGNOSIS — F0390 Unspecified dementia without behavioral disturbance: Secondary | ICD-10-CM | POA: Insufficient documentation

## 2018-02-09 DIAGNOSIS — M545 Low back pain: Secondary | ICD-10-CM | POA: Diagnosis present

## 2018-02-09 DIAGNOSIS — Z79899 Other long term (current) drug therapy: Secondary | ICD-10-CM | POA: Diagnosis not present

## 2018-02-09 DIAGNOSIS — M25551 Pain in right hip: Secondary | ICD-10-CM | POA: Insufficient documentation

## 2018-02-09 MED ORDER — DICLOFENAC SODIUM 1 % TD GEL: 2.0000 g | Freq: Four times a day (QID) | TRANSDERMAL | 0 refills | Status: AC

## 2018-02-09 MED ORDER — LIDOCAINE 5 % EX PTCH
1.0000 | MEDICATED_PATCH | Freq: Once | CUTANEOUS | Status: DC
  Filled 2018-02-09: qty 1

## 2018-02-09 MED ORDER — LIDOCAINE 5 % EX PTCH: 1.0000 | MEDICATED_PATCH | CUTANEOUS | 0 refills | Status: AC

## 2018-02-09 MED ORDER — LIDOCAINE 5 % EX PTCH: 1.0000 | MEDICATED_PATCH | CUTANEOUS | 0 refills | Status: DC

## 2018-02-09 MED ORDER — ACETAMINOPHEN 325 MG PO TABS
650.0000 mg | ORAL_TABLET | Freq: Once | ORAL | Status: AC
  Administered 2018-02-09: 650 mg via ORAL
  Filled 2018-02-09: qty 2

## 2018-02-09 MED ORDER — DICLOFENAC SODIUM 1 % TD GEL: 2.0000 g | Freq: Four times a day (QID) | TRANSDERMAL | 0 refills | Status: DC

## 2018-02-09 NOTE — ED Notes (Signed)
Transport called via [employer] dispatch @ 2142

## 2018-02-09 NOTE — ED Notes (Signed)
Patient changed into PJs and clean pullup brief.

## 2018-02-09 NOTE — ED Provider Notes (Signed)
[facility] EMERGENCY DEPARTMENT Provider Note   CSN: [id number] Arrival date & time: 02/09/18  1905     History   Chief Complaint Chief Complaint  Patient presents with  . Hip Pain    HPI  Level 5 caveat: Dementia  Madison Murray is a 84 y.o. female a PMH of dementia who presents with right buttock pain.  Her daughter does not think she has had any falls, since she is wheelchair-bound and would have been found down if she had fallen. Her daughter reports that 2 days ago, patient was complaining of some right hip pain and was "slumped over on her right side."  She was given some Tylenol, which she thinks helped her pain.  Then earlier this afternoon, when patient was being helped to the toilet, she would scream in pain anytime her right leg was moved.  Her daughter brought her to the emergency department to ensure that she does not have a fracture.  No nursing staff at [facility] has mentioned any skin breakdown or swelling or bruising in that area.  Patient has had a normal appetite and has had normal behavior according to her daughter, who sees her every day.   Past Medical History:  Diagnosis Date  . Dementia (HCC)   . Depression   . HOH (hard of hearing)   . Hypertension   . Macular degeneration   . Osteoarthritis     Patient Active Problem List   Diagnosis Date Noted  . Paronychia of great toe of left foot 12/25/2014  . Ingrown nail 09/21/2014  . Onychomycosis 09/21/2014  . Pain in lower limb 09/21/2014    History reviewed. No pertinent surgical history.   OB History   None      Home Medications    Prior to Admission medications   Medication Sig Start Date End Date Taking? Authorizing Provider  acetaminophen (TYLENOL) 500 MG tablet Take 500 mg by mouth every 6 (six) hours as needed.   Yes [provider]  calcium-vitamin D (OSCAL WITH D) 500-200 MG-UNIT tablet Take 1 tablet by mouth.   Yes [provider]  loperamide (IMODIUM  A-D) 2 MG tablet Take 2 mg by mouth 4 (four) times daily as needed for diarrhea or loose stools.   Yes [provider]  multivitamin-lutein (OCUVITE-LUTEIN) CAPS capsule Take 1 capsule by mouth daily.   Yes [provider]  sertraline (ZOLOFT) 25 MG tablet Take 25 mg by mouth daily.   Yes [provider]  diclofenac sodium (VOLTAREN) 1 % GEL Apply 2 g topically 4 (four) times daily. 02/09/18   Curatolo, Adam, DO  lidocaine (LIDODERM) 5 % Place 1 patch onto the skin daily. Remove & Discard patch within 12 hours or as directed by MD 02/09/18 03/11/18  Virgina Norfolk, DO  metoprolol tartrate (LOPRESSOR) 25 MG tablet  09/08/14   [provider]    Family History No family history on file.  Social History Social History   Tobacco Use  . Smoking status: Never Smoker  . Smokeless tobacco: Never Used  Substance Use Topics  . Alcohol use: No    Alcohol/week: 0.0 standard drinks  . Drug use: No     Allergies   Demerol [meperidine]; Iodine; Ivp dye [iodinated diagnostic agents]; Naproxen; Other; Percocet [oxycodone-acetaminophen]; and Phenergan [promethazine hcl]   Review of Systems Review of Systems Could not obtain review of systems due to level 5 caveat  Physical Exam Updated Vital Signs BP (!) 152/54 (BP Location: Left  Arm)   Pulse 62   Temp 98.2 F (36.8 C) (Oral)   Resp 16   SpO2 (!) 64%   Physical Exam  Constitutional: No distress.  Thin, elderly  HENT:  Head: Normocephalic and atraumatic.  Right Ear: External ear normal.  Left Ear: External ear normal.  Nose: Nose normal.  Eyes: Conjunctivae and EOM are normal.  Neck: Normal range of motion.  Cardiovascular: Normal rate and regular rhythm.  Pulmonary/Chest: Effort normal and breath sounds normal. No respiratory distress.  Abdominal: Soft. Bowel sounds are normal. There is no tenderness.  Musculoskeletal: Normal range of motion. She exhibits no edema, tenderness or deformity.  No  skin breakdown, swelling, or bruising noted on the right hip.  Nontender to palpation.  Patient has full passive range of motion and limited active range of motion, although this may be due to her inability to follow directions.  Skin: She is not diaphoretic.     ED Treatments / Results  Labs (all labs ordered are listed, but only abnormal results are displayed) Labs Reviewed - No data to display  EKG None  Radiology Dg Hip Port Unilat W Or Wo Pelvis 1 View Right  Result Date: 02/09/2018 CLINICAL DATA:  Initial evaluation for acute/sudden right hip pain. No known injury. EXAM: DG HIP (WITH OR WITHOUT PELVIS) 1V PORT RIGHT COMPARISON:  None. FINDINGS: Bones are diffusely osteopenic, somewhat limiting evaluation for possible subtle nondisplaced fractures. No acute fracture dislocation about the right hip. Right femoral head in normal alignment within the acetabulum. Femoral head height maintained. Bony pelvis grossly intact. Sequelae of prior ORIF at the left hip. No obvious hardware complication. No acute soft tissue abnormality. Prominent degenerative changes noted within lower lumbar spine. IMPRESSION: No acute osseous abnormality about the right hip. Electronically Signed   By: Rise Mu M.D.   On: 02/09/2018 21:20    Procedures Procedures (including critical care time)  Medications Ordered in ED Medications  lidocaine (LIDODERM) 5 % 1 patch (1 patch Transdermal Not Given 02/09/18 2046)  acetaminophen (TYLENOL) tablet 650 mg (650 mg Oral Given 02/09/18 2001)     Initial Impression / Assessment and Plan / ED Course  I have reviewed the triage vital signs and the nursing notes.  Pertinent labs & imaging results that were available during my care of the patient were reviewed by me and considered in my medical decision making (see chart for details).     Patient's exam is reassuring that she likely does not have a fracture, but we will obtain a portable hip x-ray since  patient does have a history of osteoporosis and is not a reliable historian.  She does not have any concerning systemic signs or symptoms.  Gave Tylenol for pain relief while in the hospital.  Hip x-ray is negative for bony abnormality.  Patient was prescribed Voltaren gel and lidocaine patches for use in the nursing home.  Her daughter was also advised to use Tylenol for pain control.  Patient was felt to be safe for discharge.  Final Clinical Impressions(s) / ED Diagnoses   Final diagnoses:  Right hip pain    ED Discharge Orders         Ordered    lidocaine (LIDODERM) 5 %  Every 24 hours,   Status:  Discontinued     02/09/18 2134    diclofenac sodium (VOLTAREN) 1 % GEL  4 times daily,   Status:  Discontinued     02/09/18 2134    diclofenac sodium (  VOLTAREN) 1 % GEL  4 times daily     02/09/18 2135    lidocaine (LIDODERM) 5 %  Every 24 hours     02/09/18 2135           Lennox Solders, MD 02/09/18 2213    Virgina Norfolk, DO 02/10/18 0001

## 2018-02-09 NOTE — ED Triage Notes (Signed)
Pt to ED via EMS with c/o right buttock pain. Pain worsens with movement. No known fall or injury. Pt has hx of dementia.

## 2019-07-14 IMAGING — DX DG CHEST 1V PORT
1 series · 1 of 1 positions shown · non-contrast
Comparison: Prior chest x-ray 11/08/2016

CLINICAL DATA: [AGE] female with possible urinary tract
infection

EXAM:
PORTABLE CHEST 1 VIEW

[chest ap]
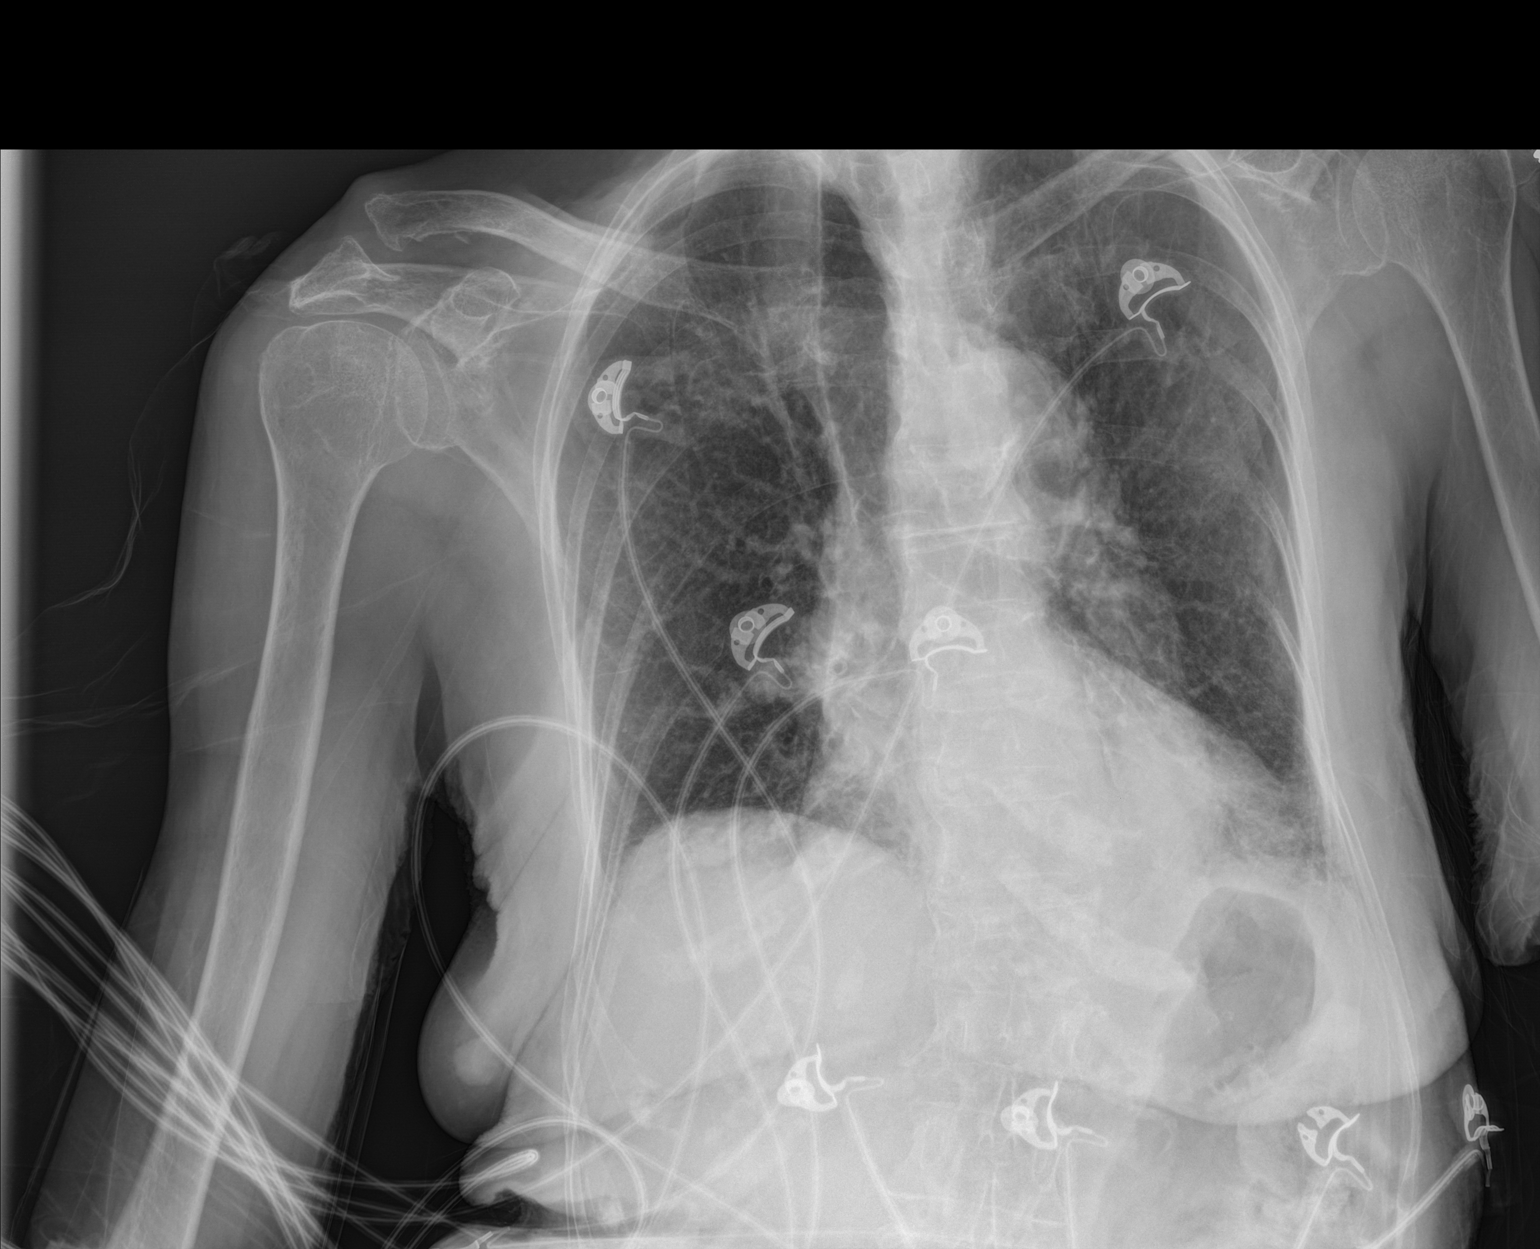

[1 of 1 positions shown; findings below may reference images not displayed]

FINDINGS: Stable cardiac mediastinal contours. Atherosclerotic calcifications
are present in the aorta. Stable chronic bronchitic changes and
interstitial prominence. No significant interval change in the
appearance of the lungs compared to prior imaging. Lingular
atelectasis versus scarring is similar. No acute osseous
abnormality.
IMPRESSION: Stable chest x-ray without evidence of acute cardiopulmonary
process.

## 2020-01-26 DEATH — deceased
